# Patient Record
Sex: Female | Born: 1970 | Race: White | Hispanic: No | Marital: Married | Smoking: Current every day smoker
Health system: Southern US, Community
[De-identification: ages and names within clinical notes are randomized; demographics above are authoritative.]

## PROBLEM LIST (undated history)

## (undated) DIAGNOSIS — E785 Hyperlipidemia, unspecified: Secondary | ICD-10-CM

## (undated) DIAGNOSIS — K219 Gastro-esophageal reflux disease without esophagitis: Secondary | ICD-10-CM

## (undated) DIAGNOSIS — F319 Bipolar disorder, unspecified: Secondary | ICD-10-CM

## (undated) HISTORY — PX: LAPAROSCOPIC OOPHERECTOMY: SHX6507

## (undated) HISTORY — DX: Gastro-esophageal reflux disease without esophagitis: K21.9

## (undated) HISTORY — PX: LEEP: SHX91

## (undated) HISTORY — DX: Hyperlipidemia, unspecified: E78.5

## (undated) HISTORY — PX: UPPER GI ENDOSCOPY: SHX6162

## (undated) HISTORY — PX: SCAR REVISION: SHX5285

## (undated) HISTORY — PX: COLONOSCOPY: SHX174

## (undated) HISTORY — PX: DILATION AND CURETTAGE OF UTERUS: SHX78

## (undated) HISTORY — PX: CHOLECYSTECTOMY: SHX55

## (undated) HISTORY — DX: Bipolar disorder, unspecified: F31.9

---

## 1998-06-23 HISTORY — PX: TUBAL LIGATION: SHX77

## 1999-06-24 HISTORY — PX: HERNIA REPAIR: SHX51

## 1999-06-24 HISTORY — PX: ABDOMINAL HYSTERECTOMY: SHX81

## 2004-05-27 ENCOUNTER — Emergency Department: Payer: Self-pay | Admitting: Emergency Medicine

## 2004-10-21 ENCOUNTER — Emergency Department: Payer: Self-pay | Admitting: Emergency Medicine

## 2005-01-06 ENCOUNTER — Emergency Department: Payer: Self-pay | Admitting: Emergency Medicine

## 2005-01-14 ENCOUNTER — Emergency Department: Payer: Self-pay | Admitting: Emergency Medicine

## 2005-03-22 ENCOUNTER — Emergency Department: Payer: Self-pay | Admitting: Emergency Medicine

## 2005-05-21 ENCOUNTER — Emergency Department: Payer: Self-pay | Admitting: Internal Medicine

## 2005-06-20 ENCOUNTER — Emergency Department: Payer: Self-pay | Admitting: General Practice

## 2005-07-09 ENCOUNTER — Emergency Department: Payer: Self-pay | Admitting: Emergency Medicine

## 2005-09-16 ENCOUNTER — Emergency Department: Payer: Self-pay | Admitting: Emergency Medicine

## 2006-02-10 ENCOUNTER — Emergency Department: Payer: Self-pay | Admitting: Emergency Medicine

## 2006-03-06 ENCOUNTER — Emergency Department: Payer: Self-pay | Admitting: Emergency Medicine

## 2006-03-06 ENCOUNTER — Other Ambulatory Visit: Payer: Self-pay

## 2006-06-01 ENCOUNTER — Emergency Department: Payer: Self-pay | Admitting: Internal Medicine

## 2006-06-02 ENCOUNTER — Ambulatory Visit: Payer: Self-pay | Admitting: Internal Medicine

## 2006-07-24 ENCOUNTER — Emergency Department: Payer: Self-pay | Admitting: Emergency Medicine

## 2006-11-10 ENCOUNTER — Emergency Department: Payer: Self-pay

## 2006-11-12 ENCOUNTER — Emergency Department: Payer: Self-pay | Admitting: Emergency Medicine

## 2006-11-19 ENCOUNTER — Emergency Department: Payer: Self-pay | Admitting: Emergency Medicine

## 2006-11-23 ENCOUNTER — Emergency Department: Payer: Self-pay | Admitting: Internal Medicine

## 2006-11-30 ENCOUNTER — Other Ambulatory Visit: Payer: Self-pay

## 2006-12-01 ENCOUNTER — Inpatient Hospital Stay: Payer: Self-pay | Admitting: Internal Medicine

## 2007-01-07 ENCOUNTER — Emergency Department: Payer: Self-pay | Admitting: Emergency Medicine

## 2007-01-13 ENCOUNTER — Ambulatory Visit: Payer: Self-pay | Admitting: Specialist

## 2007-04-05 ENCOUNTER — Emergency Department: Payer: Self-pay

## 2007-08-14 ENCOUNTER — Emergency Department: Payer: Self-pay | Admitting: Emergency Medicine

## 2007-09-13 ENCOUNTER — Emergency Department: Payer: Self-pay | Admitting: Emergency Medicine

## 2007-09-18 ENCOUNTER — Emergency Department: Payer: Self-pay | Admitting: Emergency Medicine

## 2007-10-01 ENCOUNTER — Inpatient Hospital Stay: Payer: Self-pay | Admitting: Psychiatry

## 2007-10-01 ENCOUNTER — Other Ambulatory Visit: Payer: Self-pay

## 2007-11-17 ENCOUNTER — Emergency Department: Payer: Self-pay | Admitting: Emergency Medicine

## 2008-01-15 ENCOUNTER — Emergency Department: Payer: Self-pay | Admitting: Emergency Medicine

## 2008-01-16 ENCOUNTER — Emergency Department: Payer: Self-pay | Admitting: Unknown Physician Specialty

## 2008-01-16 ENCOUNTER — Other Ambulatory Visit: Payer: Self-pay

## 2008-01-24 ENCOUNTER — Emergency Department: Payer: Self-pay | Admitting: Emergency Medicine

## 2008-01-26 ENCOUNTER — Ambulatory Visit: Payer: Self-pay | Admitting: Urology

## 2008-02-01 ENCOUNTER — Emergency Department: Payer: Self-pay | Admitting: Emergency Medicine

## 2008-02-01 ENCOUNTER — Other Ambulatory Visit: Payer: Self-pay

## 2008-02-13 ENCOUNTER — Other Ambulatory Visit: Payer: Self-pay

## 2008-02-13 ENCOUNTER — Emergency Department: Payer: Self-pay | Admitting: Emergency Medicine

## 2008-03-23 ENCOUNTER — Emergency Department: Payer: Self-pay | Admitting: Internal Medicine

## 2008-03-23 ENCOUNTER — Other Ambulatory Visit: Payer: Self-pay

## 2008-04-25 ENCOUNTER — Emergency Department: Payer: Self-pay | Admitting: Emergency Medicine

## 2008-05-13 ENCOUNTER — Emergency Department: Payer: Self-pay | Admitting: Emergency Medicine

## 2008-06-29 ENCOUNTER — Emergency Department: Payer: Self-pay | Admitting: Internal Medicine

## 2008-08-10 ENCOUNTER — Emergency Department: Payer: Self-pay | Admitting: Emergency Medicine

## 2008-10-04 ENCOUNTER — Emergency Department: Payer: Self-pay | Admitting: Emergency Medicine

## 2008-10-05 ENCOUNTER — Emergency Department: Payer: Self-pay | Admitting: Emergency Medicine

## 2008-10-19 ENCOUNTER — Emergency Department: Payer: Self-pay | Admitting: Emergency Medicine

## 2008-10-20 ENCOUNTER — Emergency Department: Payer: Self-pay | Admitting: Emergency Medicine

## 2009-01-19 ENCOUNTER — Emergency Department: Payer: Self-pay | Admitting: Emergency Medicine

## 2009-07-31 ENCOUNTER — Emergency Department: Payer: Self-pay | Admitting: Unknown Physician Specialty

## 2010-01-30 ENCOUNTER — Emergency Department: Payer: Self-pay | Admitting: Emergency Medicine

## 2010-02-02 IMAGING — CT CT CERVICAL SPINE WITHOUT CONTRAST
3 series · 16 of 33 positions shown, 19 images · non-contrast
Comparison: none

REASON FOR EXAM: FELL
COMMENTS:

PROCEDURE:     CT  - CT CERVICAL SPINE WO  - August 14, 2007  [DATE]
RESULT:     No acute soft tissue or bony abnormality is identified.

[Series 3: coronal · coronal · 0.40mm/px · 3 of 36 slices shown]
[im 8/36  bone]
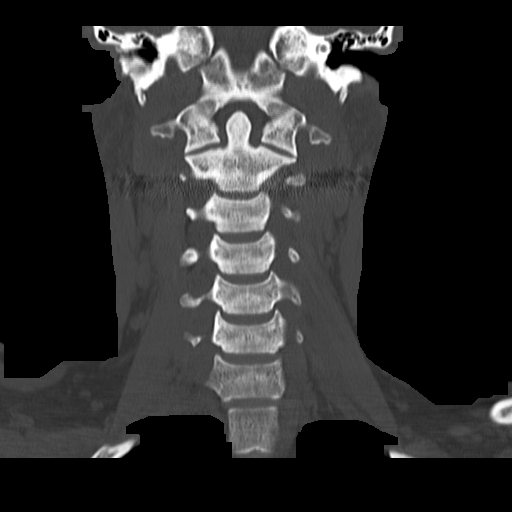
[im 15/36  bone]
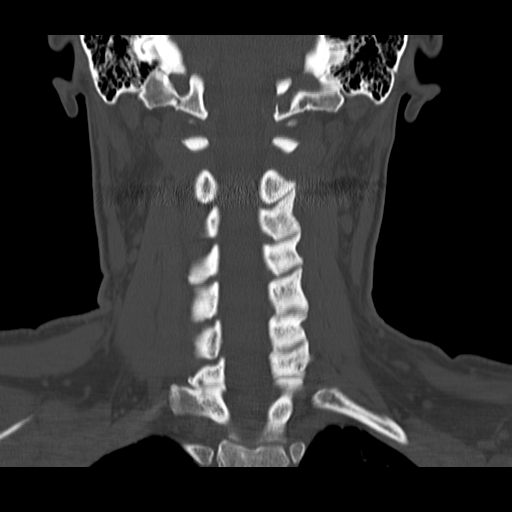
[im 22/36  bone]
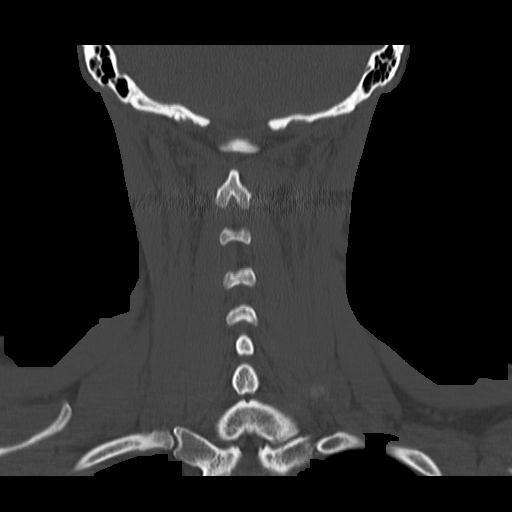

[Series 4: sagittal · sagittal · 0.40mm/px · 5 of 42 slices shown, 6 images]
[im 14/42  bone]
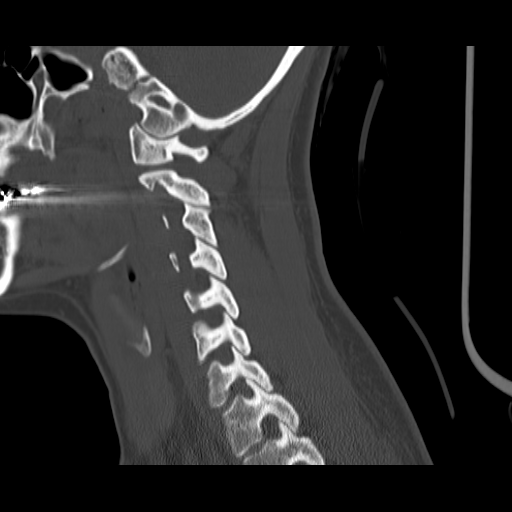
[im 18/42  bone]
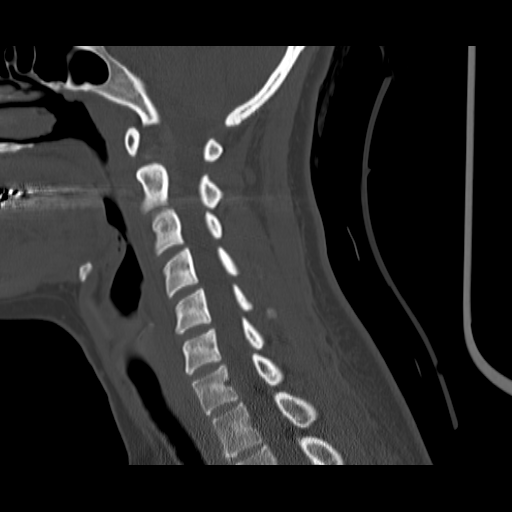
[im 21/42  soft-tissue]
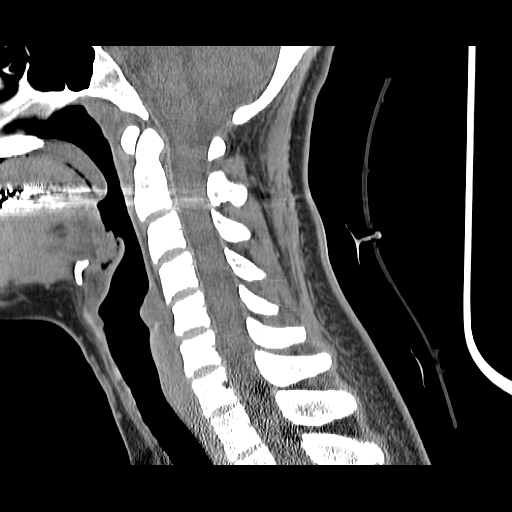
[im 21/42  bone]
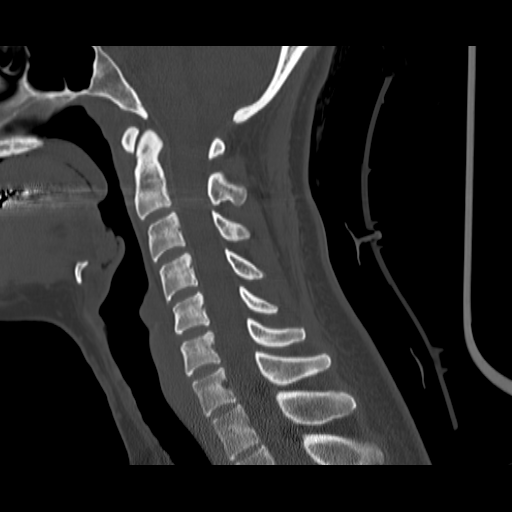
[im 24/42  bone]
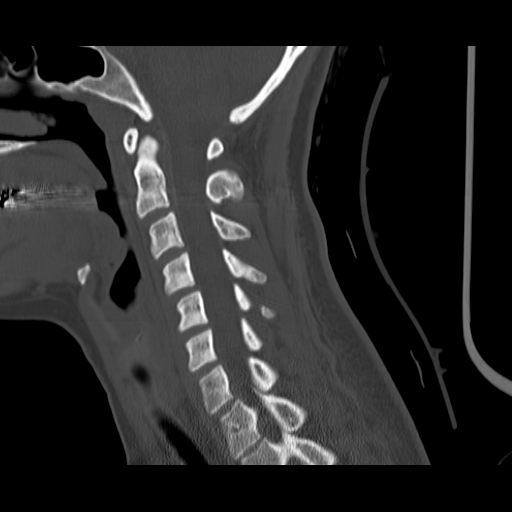
[im 28/42  bone]
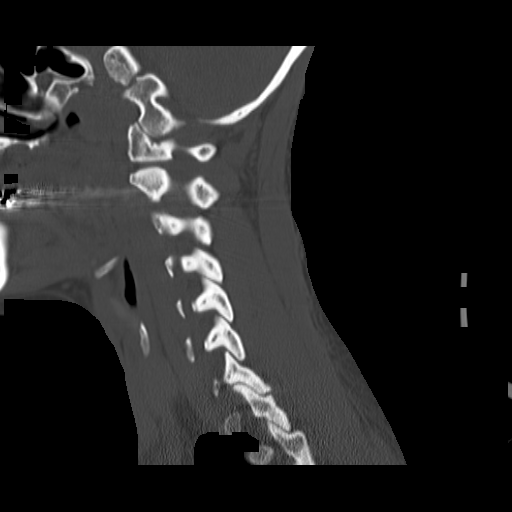

[Series 5: axial · axial · 0.30mm/px · z∈[-135,-3]mm · 8 of 79 slices shown, 10 images]
[im 7/79  soft-tissue]
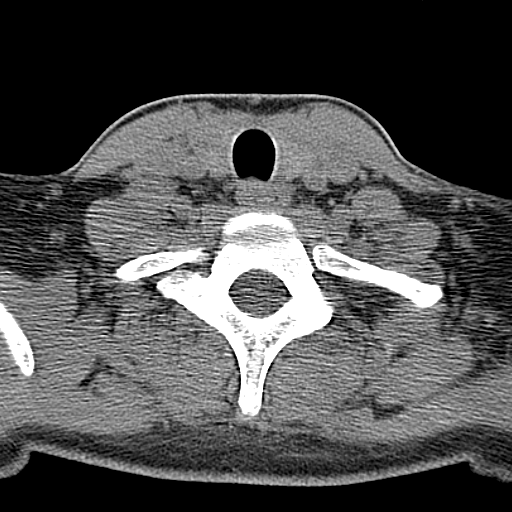
[im 7/79  bone]
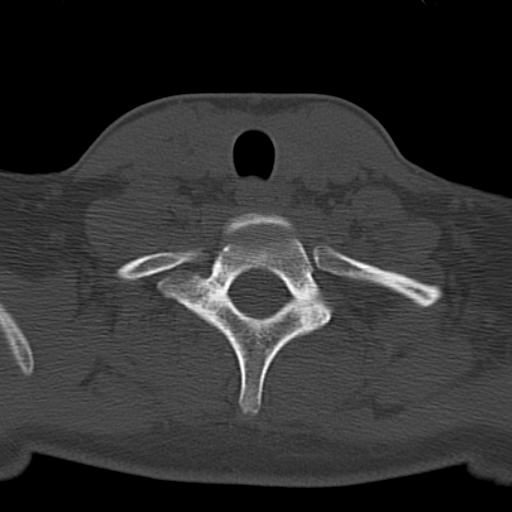
[im 19/79  bone]
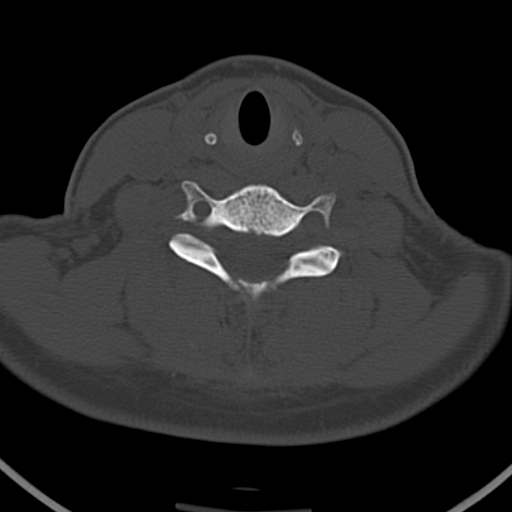
[im 25/79  bone]
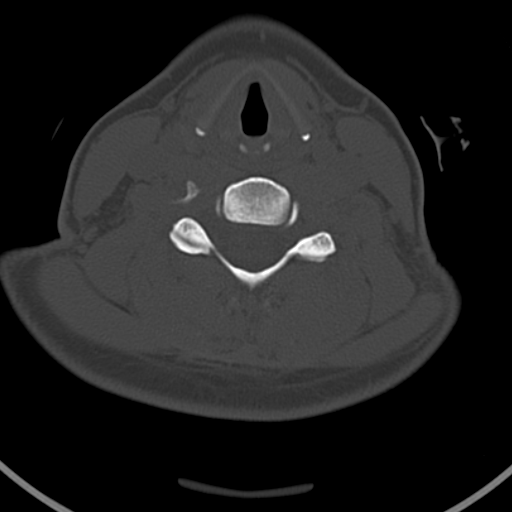
[im 37/79  bone]
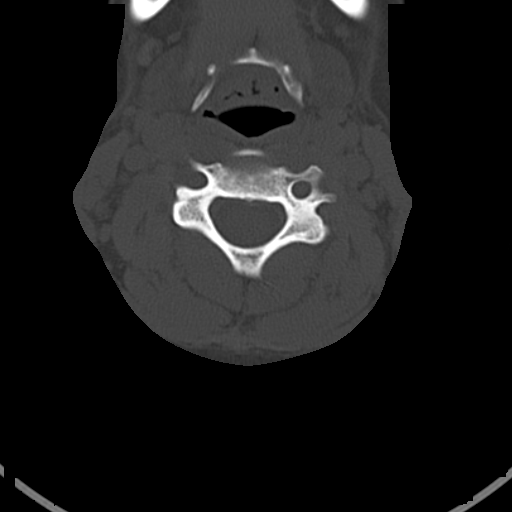
[im 43/79  soft-tissue]
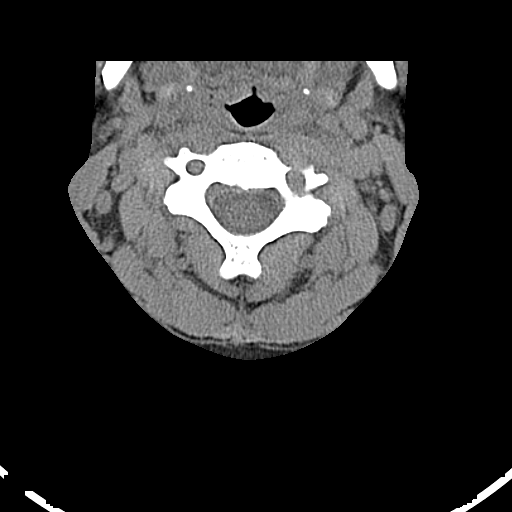
[im 43/79  bone]
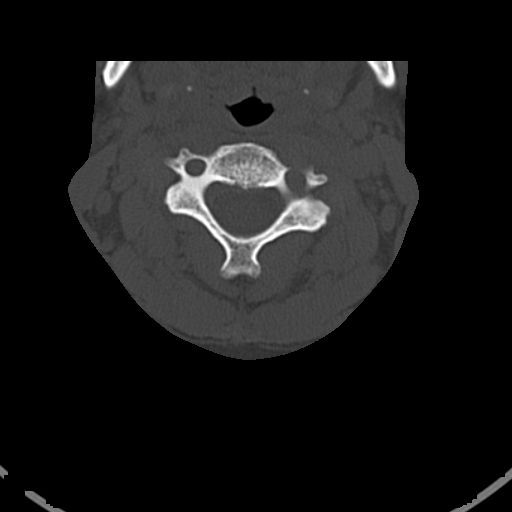
[im 55/79  bone]
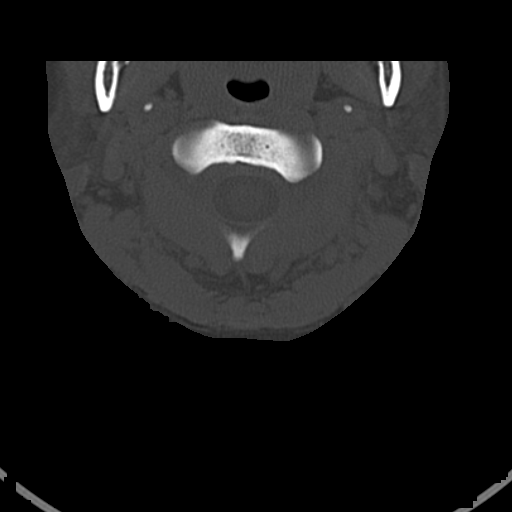
[im 61/79  bone]
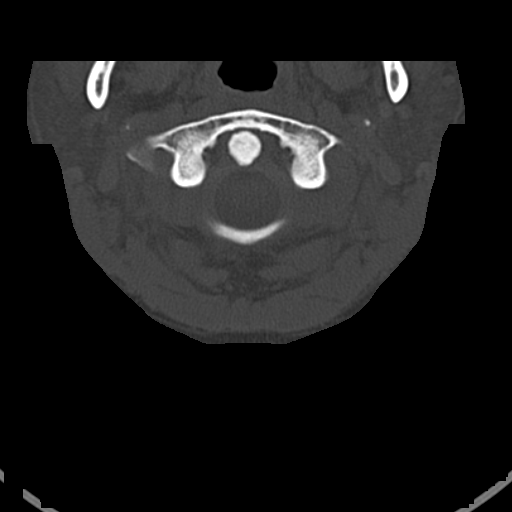
[im 73/79  bone]
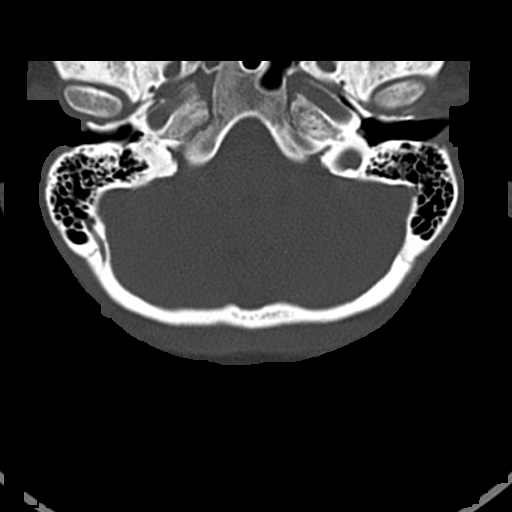

[16 of 33 positions shown; findings below may reference images not displayed]

IMPRESSION: No acute soft tissue or bony abnormalities.

## 2010-02-13 ENCOUNTER — Emergency Department: Payer: Self-pay | Admitting: Emergency Medicine

## 2010-06-24 ENCOUNTER — Emergency Department: Payer: Self-pay | Admitting: Emergency Medicine

## 2010-08-04 IMAGING — CR DG CHEST 2V
1 series · 2 of 2 positions shown · non-contrast
Comparison: none

REASON FOR EXAM: Chest Pain
COMMENTS:

PROCEDURE:     DXR - DXR CHEST PA (OR AP) AND LATERAL  - February 13, 2008  [DATE]
RESULT:     Comparison: 11/17/2007

[Series 1: view not recorded · 0.17mm/px · 2 of 2 slices shown]
[im 1/2]
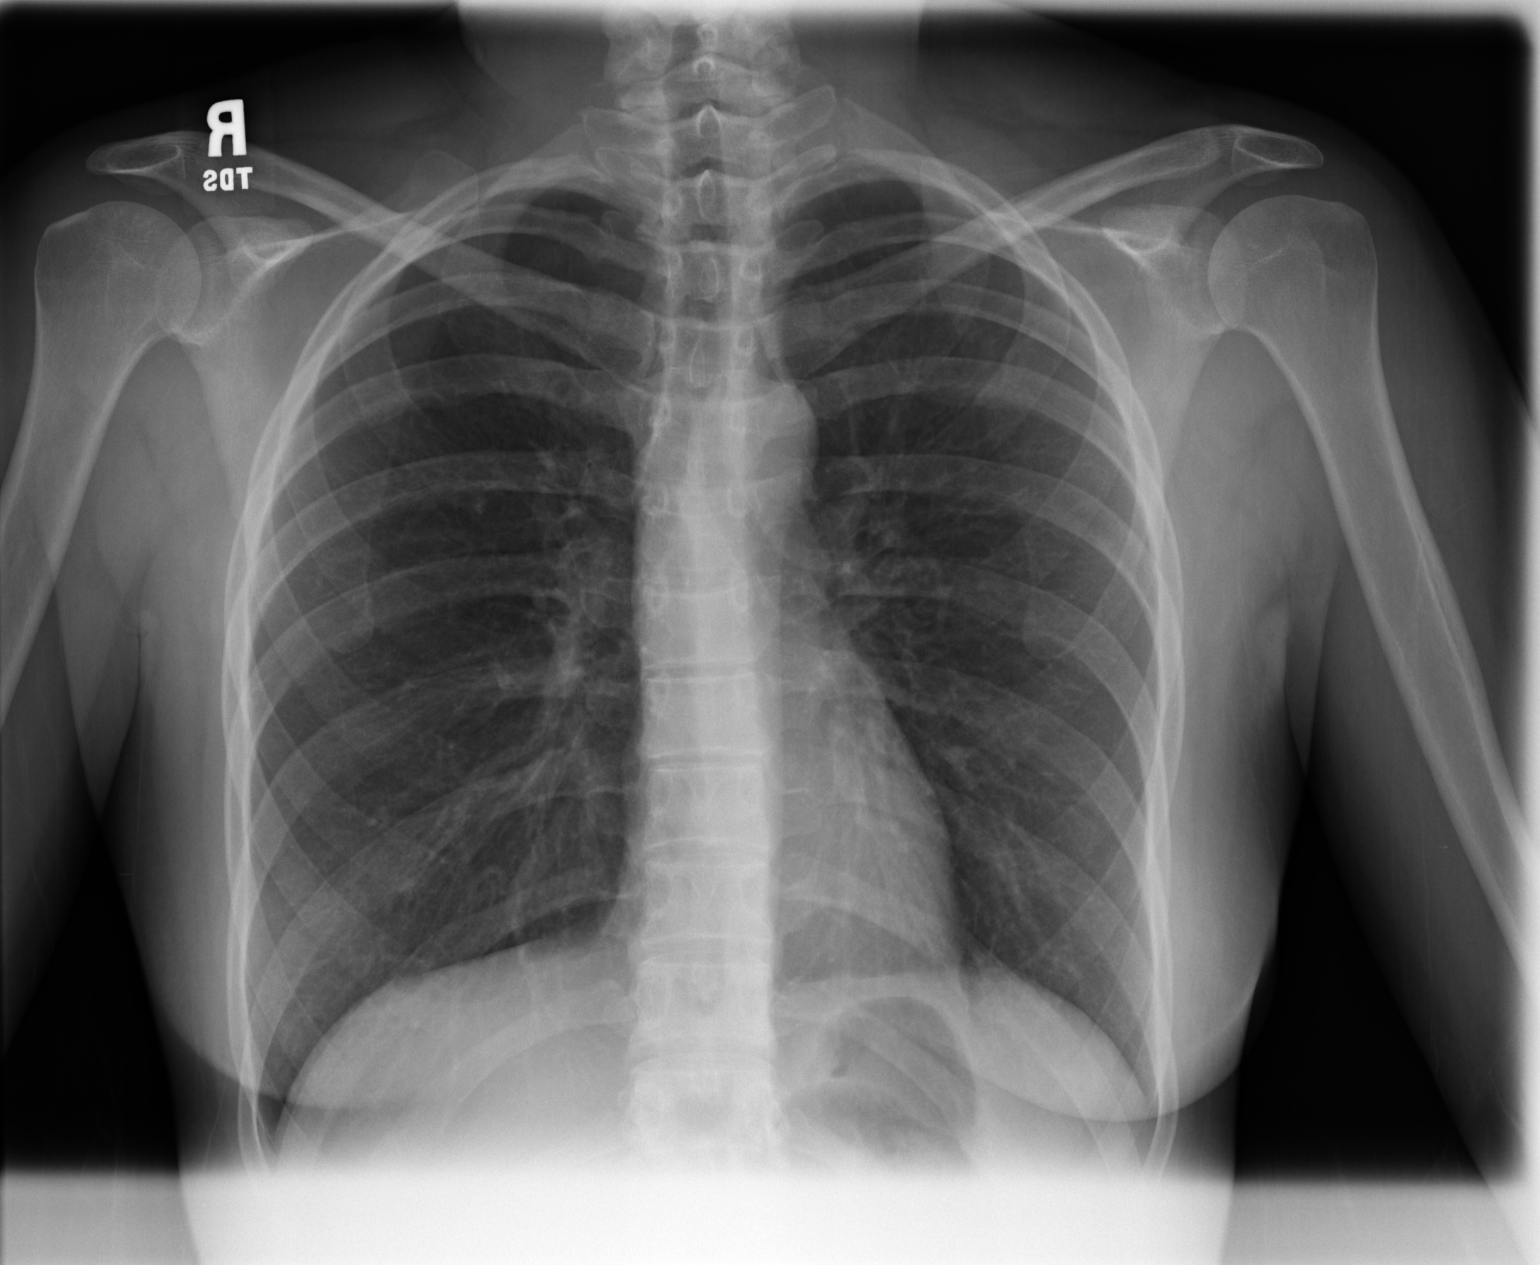
[im 2/2]
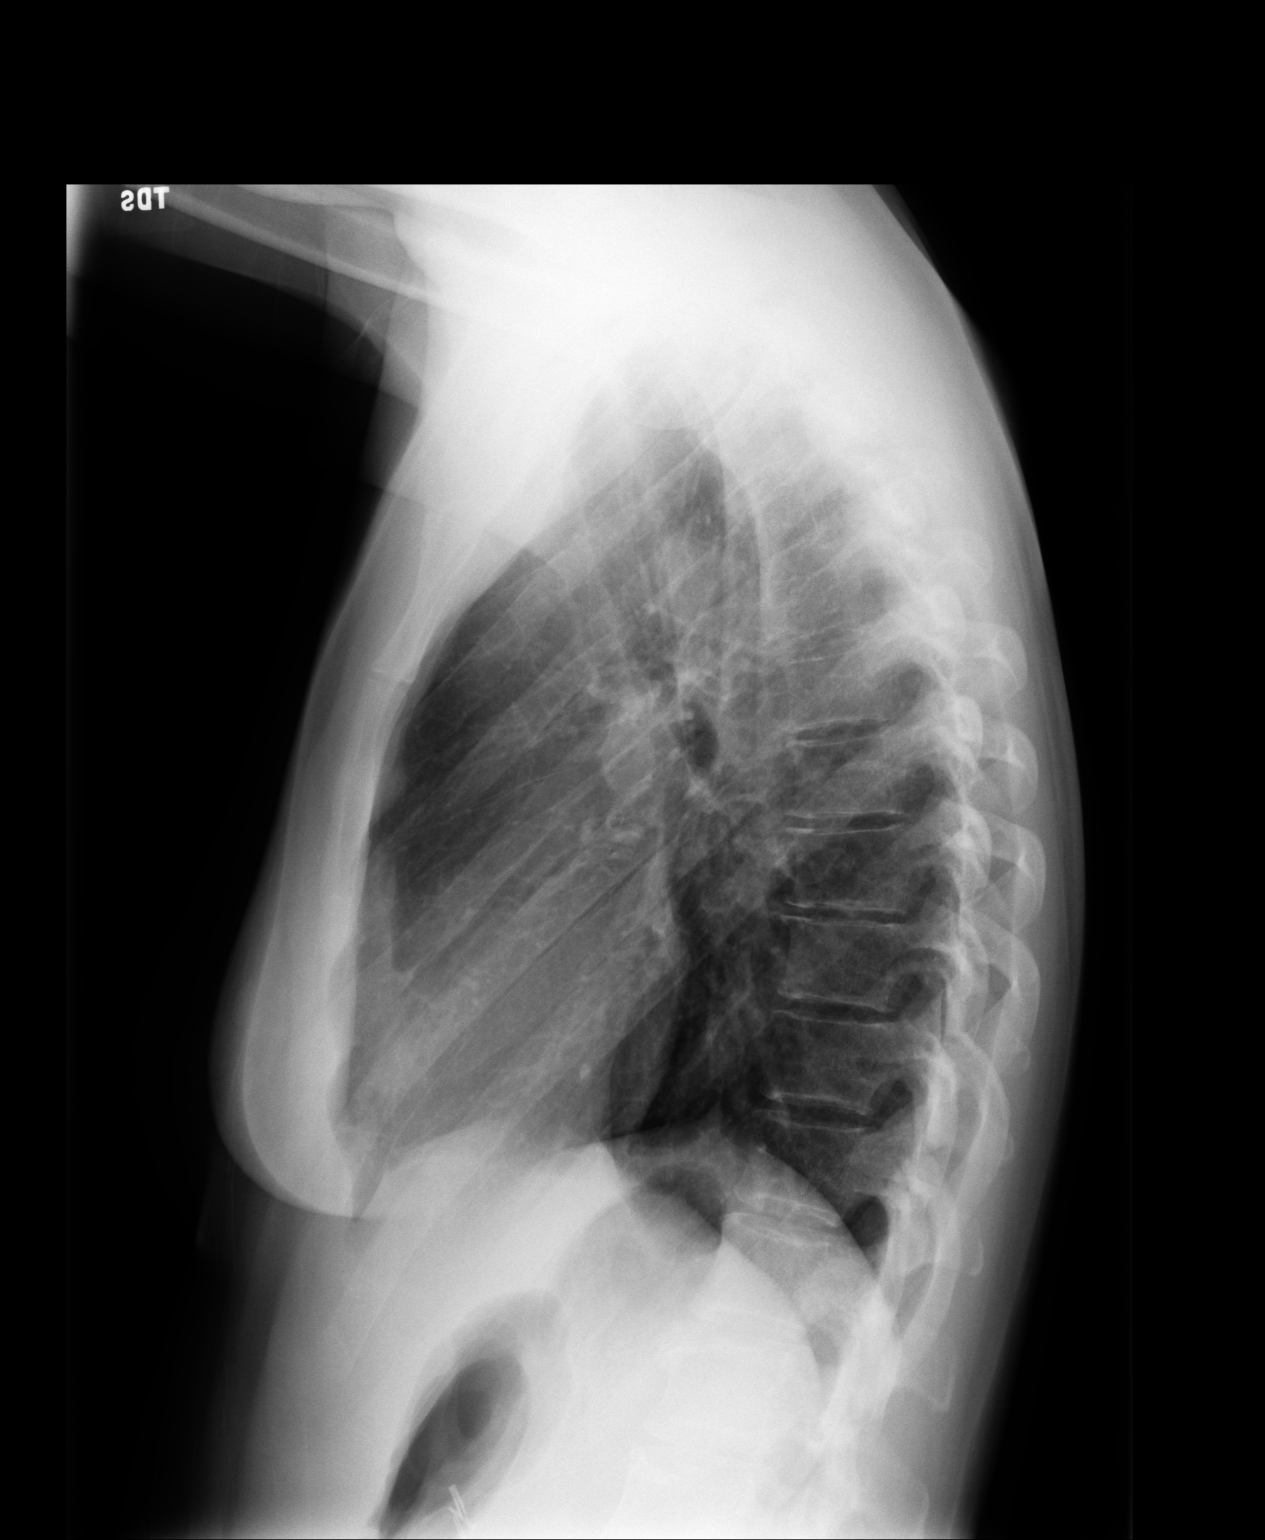

[2 of 2 positions shown; findings below may reference images not displayed]

FINDINGS: PA and lateral chest radiographs are provided. There is no focal parenchymal
opacity, pleural effusion, or pneumothorax. The heart and mediastinum are
unremarkable. The osseous structures are unremarkable.
IMPRESSION: No acute disease of the chest.

## 2010-09-23 ENCOUNTER — Emergency Department: Payer: Self-pay | Admitting: Emergency Medicine

## 2010-09-29 ENCOUNTER — Emergency Department: Payer: Self-pay | Admitting: Emergency Medicine

## 2010-11-16 ENCOUNTER — Emergency Department: Payer: Self-pay | Admitting: Emergency Medicine

## 2012-11-13 ENCOUNTER — Emergency Department: Payer: Self-pay | Admitting: Emergency Medicine

## 2012-11-13 LAB — COMPREHENSIVE METABOLIC PANEL
Alkaline Phosphatase: 93 U/L (ref 50–136)
Anion Gap: 5 — ABNORMAL LOW (ref 7–16)
Calcium, Total: 8.5 mg/dL (ref 8.5–10.1)
Chloride: 108 mmol/L — ABNORMAL HIGH (ref 98–107)
Creatinine: 0.63 mg/dL (ref 0.60–1.30)
EGFR (African American): >60
Osmolality: 278 (ref 275–301)
SGPT (ALT): 14 U/L (ref 12–78)

## 2012-11-13 LAB — URINALYSIS, COMPLETE
Bilirubin,UR: NEGATIVE
Blood: NEGATIVE
Glucose,UR: NEGATIVE mg/dL (ref 0–75)
Ketone: NEGATIVE
Leukocyte Esterase: NEGATIVE
Nitrite: NEGATIVE
Ph: 6 (ref 4.5–8.0)
Protein: NEGATIVE
Squamous Epithelial: 2
WBC UR: 2 /HPF (ref 0–5)

## 2012-11-13 LAB — CBC
HGB: 13.6 g/dL (ref 12.0–16.0)
MCHC: 34.8 g/dL (ref 32.0–36.0)
MCV: 93 fL (ref 80–100)
RBC: 4.23 10*6/uL (ref 3.80–5.20)
RDW: 13.2 % (ref 11.5–14.5)
WBC: 9.7 10*3/uL (ref 3.6–11.0)

## 2016-03-10 ENCOUNTER — Emergency Department
Admission: EM | Admit: 2016-03-10 | Discharge: 2016-03-10 | Disposition: A | Discharge status: Home / Self Care | Payer: Self-pay | Attending: Emergency Medicine | Admitting: Emergency Medicine

## 2016-03-10 ENCOUNTER — Emergency Department: Payer: Self-pay

## 2016-03-10 ENCOUNTER — Encounter: Payer: Self-pay | Admitting: *Deleted

## 2016-03-10 DIAGNOSIS — Y9289 Other specified places as the place of occurrence of the external cause: Secondary | ICD-10-CM | POA: Insufficient documentation

## 2016-03-10 DIAGNOSIS — M25512 Pain in left shoulder: Secondary | ICD-10-CM

## 2016-03-10 DIAGNOSIS — M25522 Pain in left elbow: Secondary | ICD-10-CM

## 2016-03-10 DIAGNOSIS — Y9389 Activity, other specified: Secondary | ICD-10-CM | POA: Insufficient documentation

## 2016-03-10 DIAGNOSIS — W010XXA Fall on same level from slipping, tripping and stumbling without subsequent striking against object, initial encounter: Secondary | ICD-10-CM | POA: Insufficient documentation

## 2016-03-10 DIAGNOSIS — Y999 Unspecified external cause status: Secondary | ICD-10-CM | POA: Insufficient documentation

## 2016-03-10 DIAGNOSIS — W19XXXA Unspecified fall, initial encounter: Secondary | ICD-10-CM

## 2016-03-10 MED ORDER — KETOROLAC TROMETHAMINE 60 MG/2ML IM SOLN
60.0000 mg | Freq: Once | INTRAMUSCULAR | Status: AC
  Administered 2016-03-10: 60 mg via INTRAMUSCULAR
  Filled 2016-03-10: qty 2

## 2016-03-10 MED ORDER — NAPROXEN 500 MG PO TABS: 500.0000 mg | ORAL_TABLET | Freq: Two times a day (BID) | ORAL | 0 refills | Status: DC

## 2016-03-10 NOTE — ED Notes (Signed)
See triage note  Slipped last week   conts to have pain to left arm/shoulder  Pt arrives with arm in sling

## 2016-03-10 NOTE — ED Triage Notes (Signed)
Pt states she slipped and fell last week and had left arm pan, states she was evaluated by urgent care, xrays done but the pain continues, arrives with arm in sling

## 2016-03-10 NOTE — ED Provider Notes (Signed)
[facility] Emergency Department Provider Note  ____________________________________________  Time seen: Approximately 4:51 PM  I have reviewed the triage vital signs and the nursing notes.   HISTORY  Chief Complaint Arm Pain    HPI Stacy Ingram is a 45 y.o. female who was seen in urgent care last week. Patient states that she slipped and fell landing on her left elbow and shoulder. Complains of continued pain despite being in a sling.   History reviewed. No pertinent past medical history.  There are no active problems to display for this patient.   No past surgical history on file.  Prior to Admission medications   Medication Sig Start Date End Date Taking? Authorizing Provider  carbamazepine (TEGRETOL) 200 MG tablet Take 200 mg by mouth at bedtime.   Yes Historical Provider, MD    Allergies Review of patient's allergies indicates no known allergies.  History reviewed. No pertinent family history.  Social History Social History  Substance Use Topics  . Smoking status: Not on file  . Smokeless tobacco: Not on file  . Alcohol use Not on file    Review of Systems Constitutional: No fever/chills Musculoskeletal:Positive for left shoulder and left elbow pain. Skin: Negative for rash. Neurological: Negative for headaches, focal weakness or numbness.  10-point ROS otherwise negative.  ____________________________________________   PHYSICAL EXAM:  VITAL SIGNS: ED Triage Vitals  Enc Vitals Group     BP 03/10/16 1641 (!) 154/76     Pulse Rate 03/10/16 1641 80     Resp 03/10/16 1641 18     Temp 03/10/16 1641 99 F (37.2 C)     Temp Source 03/10/16 1641 Oral     SpO2 03/10/16 1641 97 %     Weight 03/10/16 1642 150 lb (68 kg)     Height 03/10/16 1642 5\' 2"  (1.575 m)     Head Circumference --      Peak Flow --      Pain Score 03/10/16 1642 6     Pain Loc --      Pain Edu? --      Excl. in GC? --     Constitutional: Alert and  oriented. Well appearing and in no acute distress. Head: Atraumatic. Nose: No congestion/rhinnorhea. Mouth/Throat: Mucous membranes are moist.  Oropharynx non-erythematous. Neck: No stridor. Supple, full range of motion, nontender.  Cardiovascular: Normal rate, regular rhythm. Grossly normal heart sounds.  Good peripheral circulation. Respiratory: Normal respiratory effort.  No retractions. Lungs CTAB. Musculoskeletal: Positive point tenderness to the left shoulder and left elbow. Neurologic:  Normal speech and language. No gross focal neurologic deficits are appreciated. No gait instability. Distally neurovascularly intact upper extremity. Skin:  Skin is warm, dry and intact. No rash noted. Psychiatric: Mood and affect are normal. Speech and behavior are normal.  ____________________________________________   LABS (all labs ordered are listed, but only abnormal results are displayed)  Labs Reviewed - No data to display ____________________________________________  EKG   ____________________________________________  RADIOLOGY  No acute osseous findings. ____________________________________________   PROCEDURES  Procedure(s) performed: None  Critical Care performed: No  ____________________________________________   INITIAL IMPRESSION / ASSESSMENT AND PLAN / ED COURSE  Pertinent labs & imaging results that were available during my care of the patient were reviewed by me and considered in my medical decision making (see chart for details). Review of the [place] CSRS was performed in accordance of the [facility] prior to dispensing any controlled drugs.  Patient discharged home with Toradol 60  mg IM Rx given for Naprosyn 500 mg twice a day. Continue wearing sling which is in place. Follow-up with orthopedics as necessary.  Clinical Course    ____________________________________________   FINAL CLINICAL IMPRESSION(S) / ED DIAGNOSES  Final diagnoses:  Fall, initial encounter   Left shoulder pain  Elbow pain, left     This chart was dictated using voice recognition software/Dragon. Despite best efforts to proofread, errors can occur which can change the meaning. Any change was purely unintentional.    Evangeline Dakinharles M Zykeria Laguardia, PA-C 03/10/16 1756    Emily FilbertJonathan E Williams, MD 03/10/16 [phone]

## 2016-09-15 ENCOUNTER — Other Ambulatory Visit: Payer: Self-pay | Admitting: Internal Medicine

## 2016-09-15 DIAGNOSIS — R0789 Other chest pain: Secondary | ICD-10-CM

## 2016-09-15 DIAGNOSIS — R0602 Shortness of breath: Secondary | ICD-10-CM

## 2016-09-29 ENCOUNTER — Ambulatory Visit
Admission: RE | Admit: 2016-09-29 | Discharge: 2016-09-29 | Disposition: A | Discharge status: Home / Self Care | Payer: Self-pay | Source: Ambulatory Visit | Attending: Internal Medicine | Admitting: Internal Medicine

## 2016-09-29 DIAGNOSIS — F17200 Nicotine dependence, unspecified, uncomplicated: Secondary | ICD-10-CM | POA: Insufficient documentation

## 2016-09-29 DIAGNOSIS — R0789 Other chest pain: Secondary | ICD-10-CM | POA: Insufficient documentation

## 2016-09-29 DIAGNOSIS — R0602 Shortness of breath: Secondary | ICD-10-CM | POA: Insufficient documentation

## 2016-10-08 ENCOUNTER — Ambulatory Visit: Payer: Self-pay | Attending: Oncology | Admitting: *Deleted

## 2016-10-08 VITALS — BP 118/74 | HR 69 | Temp 97.6°F | Ht 62.0 in | Wt 161.0 lb

## 2016-10-08 DIAGNOSIS — N63 Unspecified lump in unspecified breast: Secondary | ICD-10-CM

## 2016-10-08 NOTE — Progress Notes (Signed)
Subjective:     Patient ID: Stacy Ingram, female   DOB: 1971/01/11, 46 y.o.   MRN: [id number]  HPI   Review of Systems     Objective:   Physical Exam  Pulmonary/Chest: Right breast exhibits no inverted nipple, no mass, no nipple discharge, no skin change and no tenderness. Left breast exhibits tenderness. Left breast exhibits no inverted nipple, no mass, no nipple discharge and no skin change. Breasts are symmetrical.       Assessment:     46 year old White female was originally referred to [facility] for left breast pain and mass.  Patient did not show up for her 12:30 appointment today.  Joellyn Quails called the patient to reschedule her and was told by the patient that her doctor at [facility] in [place], told her we did not schedule diagnostic mammograms and sent her to [facility] for her mammogram.  States she had to pay $150 for her mammogram, and that it was abnormal.  Stated that she would prefer to come here for any further testing if possible.  Patient came in to the cancer center later today for further evaluation. Patient states she has been having left breast pain that radiated from her chest to her left arm, with notable swelling.  States she also thought she had felt something in her left breast.  States she saw the MD at [facility] over a week ago and he prescribed IBU 800 mg three times a day, as well as Flexeril which she has not taken.  States the pain seems a lot better since taking the IBU.  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  States she did have a fall back in September and had physical therapy on the left arm.  Clinical breast exam without dominant mass, skin changes, nipple discharge or lymphadenopathy.  The left breast is tender to palpation.  The patients mammogram report is available, and was a birads 4a, noting a tiny 0.3 cm x 0.2 cm mass in the left breast.  The patient has a family history of  breast cancer in a maternal grandmother and aunt.  She would like to have a biopsy.  Discussed surgical consultation for ultrasound guided biopsy. Taught self breast awareness.  Patient has been screened for eligibility.  She does not have any insurance, Medicare or Medicaid.  She also meets financial eligibility.  Hand-out given on the Affordable Care Act.    Plan:     Patient has been scheduled to see Dr. Lemar Livings on Monday, October 13, 2016.  Consent for release of information signed and sent to [facility] for patients imaging.  Will follow-up per [facility]CP protocol.

## 2016-10-08 NOTE — Patient Instructions (Signed)
Gave patient hand-out, Women Staying Healthy, Active and Well from [facility], with education on breast health, pap smears, heart and colon health. 

## 2016-10-13 ENCOUNTER — Encounter: Payer: Self-pay | Admitting: General Surgery

## 2016-10-13 ENCOUNTER — Ambulatory Visit (INDEPENDENT_AMBULATORY_CARE_PROVIDER_SITE_OTHER): Payer: PRIVATE HEALTH INSURANCE | Admitting: General Surgery

## 2016-10-13 ENCOUNTER — Inpatient Hospital Stay: Payer: Self-pay

## 2016-10-13 VITALS — BP 120/80 | HR 76 | Resp 14 | Ht 62.0 in | Wt 168.0 lb

## 2016-10-13 DIAGNOSIS — N6001 Solitary cyst of right breast: Secondary | ICD-10-CM

## 2016-10-13 DIAGNOSIS — M940 Chondrocostal junction syndrome [Tietze]: Secondary | ICD-10-CM

## 2016-10-13 NOTE — Patient Instructions (Signed)
Use ice or heat for rib/chest pain Follow up as needed

## 2016-10-13 NOTE — Progress Notes (Signed)
Patient ID: [name], female   DOB: [birth year], 46 y.o.   MRN: [id number]  Chief Complaint  Patient presents with  . Other    breast biopsy    HPI Stacy Ingram is a 46 y.o. female she is here for evaluation for a breast mass found on mammogram.  She had ongoing pain in the left breast/chest area, this was evaluated with full cardiac work up and blood work. She had felt a mass and pain in her left breast about 3 weeks ago, but nothing in the right breast. She is here with her husband Stacy Ingram.  HPI  Past Medical History:  Diagnosis Date  . Bipolar 1 disorder (HCC)   . GERD (gastroesophageal reflux disease)   . Hyperlipidemia     Past Surgical History:  Procedure Laterality Date  . ABDOMINAL HYSTERECTOMY  2001  . CESAREAN SECTION  1992, 2000  . CHOLECYSTECTOMY  20s  . COLONOSCOPY  age 59  . DILATION AND CURETTAGE OF UTERUS     x 3  . HERNIA REPAIR  2001   umbilical  . LAPAROSCOPIC OOPHERECTOMY Left 20s  . LEEP  20s  . SCAR REVISION  2000, 2001  . TUBAL LIGATION  2000  . UPPER GI ENDOSCOPY  31s    Family History  Problem Relation Age of Onset  . Lung cancer Mother 25  . Diabetes Father   . Skin cancer Paternal Grandmother 37  . Breast cancer Maternal Aunt 61    Social History Social History  Substance Use Topics  . Smoking status: Current Every Day Smoker  . Smokeless tobacco: Never Used  . Alcohol use Yes    Allergies  Allergen Reactions  . Dilaudid [Hydromorphone Hcl] Shortness Of Breath  . Morphine Anxiety    Current Outpatient Prescriptions  Medication Sig Dispense Refill  . alum & mag hydroxide-simeth (MYLANTA) 200-200-20 MG/5ML suspension Take by mouth.    . carbamazepine (TEGRETOL) 200 MG tablet Take 200 mg by mouth at bedtime.    . citalopram (CELEXA) 10 MG tablet Take by mouth. Take 1/2 tab daily    . ibuprofen (ADVIL,MOTRIN) 800 MG tablet Take 800 mg by mouth every 8 (eight) hours as needed.    Marland Kitchen omeprazole (PRILOSEC) 20 MG capsule Take 40  mg by mouth 2 (two) times daily.     No current facility-administered medications for this visit.     Review of Systems Review of Systems  Constitutional: Negative.   Respiratory: Negative.   Cardiovascular: Negative.     Blood pressure 120/80, pulse 76, resp. rate 14, height  (1.575 m), weight 168 lb (76.2 kg).  Physical Exam Physical Exam  Constitutional: She is oriented to person, place, and time. She appears well-developed and well-nourished.  Eyes: Conjunctivae are normal. No scleral icterus.  Neck: Neck supple.  Cardiovascular: Normal rate, regular rhythm and normal heart sounds.   Pulmonary/Chest: Effort normal and breath sounds normal. Right breast exhibits no inverted nipple, no mass, no nipple discharge, no skin change and no tenderness. Left breast exhibits tenderness (ribcage area 4, 5, & 6). Left breast exhibits no inverted nipple, no mass, no nipple discharge and no skin change.    Lymphadenopathy:    She has no cervical adenopathy.    She has no axillary adenopathy.  Neurological: She is alert and oriented to person, place, and time.  Skin: Skin is warm and dry.  Psychiatric: She has a normal mood and affect.    Data Reviewed The patient  underwent imaging at [facility]. Films were not available for review. The diagnostic study for suspected palpable abnormality in the inferior/inferior medial aspect of the left breast was negative. Mammography did show a circumscribed lesion in the right breast at the 8:00 position. Ultrasound described a 3 mm anechoic structure with indistinct margins for which aspiration was recommended. BI-RADS-4 a.  Ultrasound examination of the right breast was undertaken to complete the recommended aspiration. At the 8:00 position 3 cm from the nipple a 0.28 x 0.31 x 0.42 anechoic structure with strong posterior acoustic enhancement was identified corresponding to the lesion described on the [employer] study. The patient was  amenable to aspiration. This was done making use of 0.5 mL of 1% plain Xylocaine. The area resolve completely with return of a small amount of clear fluid which was discarded.  Incidentally noted at the 9:00 position, 6 cm from nipple was a second slightly lobulated anechoic structure with posterior acoustic enhancement measuring 0.3 x 0.44 x 0.48 cm. This area was aspirated with complete resolution as well. The fluid was discarded. BIRAD-2.  Assessment    Costochondritis to account for the left medial chest wall pain.  Focal fat nodularity along the inframammary fold of the left breast accounting for the reported palpable abnormality.  Asymptomatic breast cyst, resolved on aspiration.    Plan    The patient should resume annual screening mammograms.    HPI, Physical Exam, Assessment and Plan have been scribed under the direction and in the presence of Earline Mayotte, MD  Carron Brazen, LPN  I have completed the exam and reviewed the above documentation for accuracy and completeness.  I agree with the above.  Museum/gallery conservator has been used and any errors in dictation or transcription are unintentional.  Donnalee Curry, M.D., F.A.C.S.    Earline Mayotte 10/14/2016, 8:21 PM

## 2016-11-26 ENCOUNTER — Ambulatory Visit: Payer: Self-pay | Attending: Oncology

## 2016-11-26 VITALS — BP 132/78 | HR 76 | Temp 97.7°F | Resp 18 | Ht 62.0 in | Wt 161.0 lb

## 2016-11-26 DIAGNOSIS — Z0000 Encounter for general adult medical examination without abnormal findings: Secondary | ICD-10-CM

## 2016-11-26 NOTE — Progress Notes (Addendum)
Subjective:     Patient ID: Stacy Ingram, female   DOB: [birth year], 46 y.o.   MRN: [id number]  HPI   Review of Systems     Objective:   Physical Exam  Genitourinary: No labial fusion. There is no rash, tenderness, lesion or injury on the right labia. There is no rash, tenderness, lesion or injury on the left labia. Cervix exhibits no motion tenderness, no discharge and no friability. Right adnexum displays no mass, no tenderness and no fullness. No erythema, tenderness or bleeding in the vagina. No foreign body in the vagina. No signs of injury around the vagina. No vaginal discharge found.  Genitourinary Comments: Hx of supracervical hysterectomy, and left oopherectomy       Assessment:     46 year old returned for pap.  Histroy of left oopherectomy, and [facility]. Pelvic exam normal.    Plan:     Specimen collected for pap.

## 2016-12-01 LAB — PAP LB AND HPV HIGH-RISK
HPV, high-risk: NEGATIVE
PAP SMEAR COMMENT: 0

## 2016-12-09 NOTE — Progress Notes (Signed)
Phoned patient with negative/negative pap results.  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  Reviewed paps back to 1997 where she had a CIN I-II result.  Since that time her pap smears have been negative.  She had a supracervical hysterectomy and LSO in 2006 for adhesions per note in Care Everywhere. Per [facility] guidelines next pap will be due in 5 years.  Patient to return in one year for BCCCP screening.

## 2017-09-17 ENCOUNTER — Telehealth: Payer: Self-pay | Admitting: *Deleted

## 2017-09-17 NOTE — Telephone Encounter (Signed)
Patient called yesterday and left a message for me to return her call in regards to a vaginal infection.  Called patient today.  States she has a vaginal infection and wanted to know if we could treat it.  Also states she is having pain with urinating and sitting.  States her "cervix hurts".  States she has been [facility] at [facility] pelvic [facility].  Discussed with patient that [facility] is a screening program and we cannot treat infections.  Re[facility], and that that would be a better option for her even if I could treat the infection.  She is due for her mammogram.  [facility] and [facility] birads 4.  I have scanned her a consent for release to sign and return to me.  I will schedule her a mammogram when images are received.

## 2022-07-24 ENCOUNTER — Emergency Department: Payer: No Typology Code available for payment source

## 2022-07-24 ENCOUNTER — Emergency Department
Admission: EM | Admit: 2022-07-24 | Discharge: 2022-07-24 | Disposition: A | Discharge status: Home / Self Care | Payer: No Typology Code available for payment source | Attending: Emergency Medicine | Admitting: Emergency Medicine

## 2022-07-24 DIAGNOSIS — S8992XA Unspecified injury of left lower leg, initial encounter: Secondary | ICD-10-CM | POA: Diagnosis present

## 2022-07-24 DIAGNOSIS — Y92410 Unspecified street and highway as the place of occurrence of the external cause: Secondary | ICD-10-CM | POA: Insufficient documentation

## 2022-07-24 DIAGNOSIS — S0990XA Unspecified injury of head, initial encounter: Secondary | ICD-10-CM | POA: Diagnosis not present

## 2022-07-24 DIAGNOSIS — M25531 Pain in right wrist: Secondary | ICD-10-CM | POA: Diagnosis not present

## 2022-07-24 DIAGNOSIS — S8002XA Contusion of left knee, initial encounter: Secondary | ICD-10-CM | POA: Diagnosis not present

## 2022-07-24 MED ORDER — LIDOCAINE 5 % EX PTCH: 1.0000 | MEDICATED_PATCH | Freq: Two times a day (BID) | CUTANEOUS | 0 refills | Status: AC
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
## 2022-07-24 NOTE — ED Notes (Signed)
Pt ambulatory to restroom

## 2022-07-24 NOTE — ED Notes (Signed)
Pt verbalizes understanding of discharge instructions. Opportunity for questioning and answers were provided. Pt discharged from ED to home with family.    

## 2022-07-24 NOTE — Discharge Instructions (Signed)
Your CT scans and x-rays are normal.  Please rest, ice, elevate your knee and your wrist.  Please return for any new, worsening, or change in symptoms or other concerns.  It was a pleasure caring for you today.
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
